# Patient Record
Sex: Female | Born: 1960 | Race: White | Hispanic: No | Marital: Married | Smoking: Never smoker
Health system: Southern US, Community
[De-identification: ages and names within clinical notes are randomized; demographics above are authoritative.]

---

## 1999-02-24 ENCOUNTER — Other Ambulatory Visit: Admission: RE | Admit: 1999-02-24 | Discharge: 1999-02-24 | Payer: Self-pay | Admitting: Obstetrics & Gynecology

## 1999-09-20 ENCOUNTER — Encounter (INDEPENDENT_AMBULATORY_CARE_PROVIDER_SITE_OTHER): Payer: Self-pay

## 1999-09-20 ENCOUNTER — Inpatient Hospital Stay (HOSPITAL_COMMUNITY): Admission: AD | Admit: 1999-09-20 | Discharge: 1999-09-23 | Payer: Self-pay | Admitting: Obstetrics & Gynecology

## 1999-10-26 ENCOUNTER — Other Ambulatory Visit: Admission: RE | Admit: 1999-10-26 | Discharge: 1999-10-26 | Payer: Self-pay | Admitting: Obstetrics and Gynecology

## 2000-10-29 ENCOUNTER — Other Ambulatory Visit: Admission: RE | Admit: 2000-10-29 | Discharge: 2000-10-29 | Payer: Self-pay | Admitting: Obstetrics & Gynecology

## 2000-11-15 ENCOUNTER — Ambulatory Visit (HOSPITAL_COMMUNITY): Admission: RE | Admit: 2000-11-15 | Discharge: 2000-11-15 | Payer: Self-pay | Admitting: Obstetrics & Gynecology

## 2000-11-15 ENCOUNTER — Encounter: Payer: Self-pay | Admitting: Obstetrics & Gynecology

## 2002-01-01 ENCOUNTER — Other Ambulatory Visit: Admission: RE | Admit: 2002-01-01 | Discharge: 2002-01-01 | Payer: Self-pay | Admitting: Obstetrics & Gynecology

## 2002-05-12 ENCOUNTER — Encounter: Payer: Self-pay | Admitting: Obstetrics & Gynecology

## 2002-05-12 ENCOUNTER — Ambulatory Visit (HOSPITAL_COMMUNITY): Admission: RE | Admit: 2002-05-12 | Discharge: 2002-05-12 | Payer: Self-pay | Admitting: Obstetrics & Gynecology

## 2003-01-14 ENCOUNTER — Other Ambulatory Visit: Admission: RE | Admit: 2003-01-14 | Discharge: 2003-01-14 | Payer: Self-pay | Admitting: Obstetrics & Gynecology

## 2005-07-25 ENCOUNTER — Ambulatory Visit (HOSPITAL_COMMUNITY): Admission: RE | Admit: 2005-07-25 | Discharge: 2005-07-25 | Payer: Self-pay | Admitting: Obstetrics and Gynecology

## 2007-03-06 ENCOUNTER — Ambulatory Visit (HOSPITAL_COMMUNITY): Admission: RE | Admit: 2007-03-06 | Discharge: 2007-03-06 | Payer: Self-pay | Admitting: Obstetrics & Gynecology

## 2011-02-10 NOTE — Op Note (Signed)
[facility]  Patient:    Sarah Stark                      MRN: [id number] Proc. Date: 09/20/99 Adm. Date:  [id number] Attending:  Lenoard Aden CC:         [facility]                           Operative Report  PREOPERATIVE DIAGNOSES:       Thirty-nine-week intrauterine pregnancy, previous  cesarean section and desire for vaginal birth after cesarean section and desire for elective sterilization, persistent face presentation -- mentum posterior presentation.  POSTOPERATIVE DIAGNOSES:      Thirty-nine-week intrauterine pregnancy, previous  cesarean section and desire for vaginal birth after cesarean section and desire for elective sterilization, persistent face presentation -- mentum posterior presentation.  PROCEDURE:                    Repeat C-section and tubal ligation.  SURGEON:                      Lenoard Aden, M.D.  ANESTHESIA:                   Spinal by Belva Agee, M.D.  ESTIMATED BLOOD LOSS:         1000 cc.  COMPLICATIONS:                None.  FINDINGS:                     Full-term living female, mentum posterior presentation, Apgars 8/9.  Placenta manually intact, three-vessel cord.  COMPLICATIONS:                None.  DISPOSITION:                  Patient to recovery room in good condition.  COUNTS:                       All counts correct.  INDICATIONS:                  After being apprised of the risks of anesthesia, infection, bleeding, injury to abdominal organs with need for repair, risks of tubal ligation -- a failure risk of 5 to 10 per thousand -- patient desires to undergo elective repeat.  Mentum posterior presentation explained to the patient; a poor prognosis with regards to vaginal delivery and spontaneous conversion to a  normal occiput presentation is noted, possible prolonged delivery with laryngeal and tracheal edema discussed with the patient and she will proceed  with elective repeat C-section.  After being apprised of these risks, consents are signed.  DESCRIPTION OF PROCEDURE:     Patient is brought to the operating room where she is administered a spinal anesthetic without complications and prepped and draped in the usual sterile fashion.  A Pfannenstiel skin incision is made after achieving adequate anesthesia and Foley catheter having been placed.  At this time, fascia is nicked in the midline and opened transversely using Mayo scissors, rectus muscle is dissected sharply in the midline, peritoneum entered sharply and atraumatically, bladder blade placed, visceroperitoneum scored in a smile-like fashion, uterus scored in a smile-like fashion, delivery of a full-term living female, Apgars 8/9, from a mentum posterior position and is  handed to pediatricians in attendance, receiving Apgars of 8/9.  Placenta delivered manually intact; three-vessel cord  noted.  Uterus exteriorized; normal tubes and ovaries noted.  Right tube grasped in an vascular portion and the mesosalpinx is cauterized to create a window in the  ampullary-isthmic portion of the tube.  At this point, the tube is tied with a  plain suture, proximally and distally, and the tubal segment is removed; good hemostasis achieved.  The same procedure is done on the left side after doing the right side.  Good hemostasis is achieved.  Ovaries appear normal.  Uterus replaced. Good hemostasis is noted after closing the uterus using a 0 Monocryl suture in  continuous-running fashion.  Bladder flap having been inspected, good hemostasis noted, rectus muscles are reapproximated in the midline; good hemostasis noted.  Paracolic gutters irrigated with all blood clots having subsequently been removed. Fascia closed using a 0 Vicryl in continuous-running fashion and skin closed using staples.  Patient tolerates procedure well and transferred to recovery in good condition. DD:   09/20/99 TD:  09/22/99 Job: [id number]

## 2011-02-10 NOTE — H&P (Signed)
[facility]  Patient:    Sarah Stark                      MRN: [id number] Adm. Date:  [id number] Attending:  Lenoard Aden                         History and Physical  CHIEF COMPLAINT:  Active labor.  HISTORY OF PRESENT ILLNESS:  A 50 year old white female G2, P1, EDD January 1, 001 who presents in active labor.  PAST MEDICAL HISTORY:  Remarkable for cesarean section and failure to progress n 1996.  SOCIAL HISTORY:  Negative.  FAMILY HISTORY:  Remarkable for heart disease, thyroid cancer and stroke.  ALLERGIES:  No known drug allergies.  PREGNANCY HISTORY:  Blood type A positive. Rh antibody negative.  Rubella immune. Hepatitis B surface antigen negative. HIV nonreactive.  PHYSICAL EXAMINATION:  GENERAL:  The patient is a well-developed white female in a moderate amount of distress.  HEENT:  Normal.  LUNGS:  Clear.  HEART:  Regular rate and rhythm.  ABDOMEN:  Soft gravid, nontender.  Estimated fetal weight of about 7-1/2 pounds. Cervix 4 cm 100% bulging bag, vertex noted.  ____________ membranes clear.  EXTREMITIES:  No cords.  NEUROLOGIC EXAM:  Nonfocal.  IMPRESSION: 1. A 39-week intrauterine pregnancy in active labor. 2. Previous cesarean section for VBAC.  PLAN:  Admit.  IV fluids, monitor labor closely.  Epidural p.r.n.   Patient to attempt a vaginal delivery.  Risks and benefits of vaginal delivery versus repeat cesarean section were discussed with the patient and husband today. DD:  09/20/99 TD:  09/22/99 Job: [id number]

## 2011-08-01 ENCOUNTER — Other Ambulatory Visit (HOSPITAL_COMMUNITY): Payer: Self-pay | Admitting: Obstetrics & Gynecology

## 2011-08-01 DIAGNOSIS — Z780 Asymptomatic menopausal state: Secondary | ICD-10-CM

## 2011-08-01 DIAGNOSIS — Z1231 Encounter for screening mammogram for malignant neoplasm of breast: Secondary | ICD-10-CM

## 2011-08-30 ENCOUNTER — Ambulatory Visit (HOSPITAL_COMMUNITY)
Admission: RE | Admit: 2011-08-30 | Discharge: 2011-08-30 | Disposition: A | Discharge status: Home / Self Care | Payer: BC Managed Care – PPO | Source: Ambulatory Visit | Attending: Obstetrics & Gynecology | Admitting: Obstetrics & Gynecology

## 2011-08-30 DIAGNOSIS — Z1231 Encounter for screening mammogram for malignant neoplasm of breast: Secondary | ICD-10-CM | POA: Insufficient documentation

## 2011-08-30 DIAGNOSIS — Z780 Asymptomatic menopausal state: Secondary | ICD-10-CM

## 2011-10-25 ENCOUNTER — Encounter: Payer: Self-pay | Admitting: *Deleted

## 2011-10-25 ENCOUNTER — Encounter: Payer: BC Managed Care – PPO | Attending: Endocrinology | Admitting: *Deleted

## 2011-10-25 DIAGNOSIS — Z713 Dietary counseling and surveillance: Secondary | ICD-10-CM | POA: Insufficient documentation

## 2011-10-25 DIAGNOSIS — E119 Type 2 diabetes mellitus without complications: Secondary | ICD-10-CM | POA: Insufficient documentation

## 2011-10-25 NOTE — Patient Instructions (Signed)
Patient will attend Core Diabetes Courses II and III as scheduled or follow up prn.  

## 2011-10-25 NOTE — Progress Notes (Signed)
  Patient was seen on 10/25/2011 for the first of a series of three diabetes self-management courses at the [facility]. The following learning objectives were met by the patient during this course:   Defines the role of glucose and insulin  Identifies type of diabetes and pathophysiology  Defines the diagnostic criteria for diabetes and prediabetes  States the risk factors for Type 2 Diabetes  States the symptoms of Type 2 Diabetes  Defines Type 2 Diabetes treatment goals  Defines Type 2 Diabetes treatment options  States the rationale for glucose monitoring  Identifies A1C, glucose targets, and testing times  Identifies proper sharps disposal  Defines the purpose of a diabetes food plan  Identifies carbohydrate food groups  Defines effects of carbohydrate foods on glucose levels  Identifies carbohydrate choices/grams/food labels  States benefits of physical activity and effect on glucose  Review of suggested activity guidelines  Last A1c: Unknown  Handouts given during class include:  Type 2 Diabetes: Basics Book  My Food Plan Book  Food and Activity Log  Patient has established the following initial goals:  Follow a diabetes meal plan.  Lose weight.  Follow-Up Plan: Patient will attend Core Diabetes Courses II and III as scheduled or follow up prn.

## 2011-12-07 ENCOUNTER — Encounter: Payer: BC Managed Care – PPO | Attending: Endocrinology | Admitting: *Deleted

## 2011-12-07 DIAGNOSIS — Z713 Dietary counseling and surveillance: Secondary | ICD-10-CM | POA: Insufficient documentation

## 2011-12-07 DIAGNOSIS — E119 Type 2 diabetes mellitus without complications: Secondary | ICD-10-CM | POA: Insufficient documentation

## 2011-12-08 ENCOUNTER — Encounter: Payer: Self-pay | Admitting: *Deleted

## 2011-12-08 NOTE — Progress Notes (Signed)
  Patient was seen on 12/07/2011 for the second of a series of three diabetes self-management courses at the [facility]. The following learning objectives were met by the patient during this course:   Explain basic nutrition maintenance and quality assurance  Describe causes, symptoms and treatment of hypoglycemia and hyperglycemia  Explain how to manage diabetes during illness  Describe the importance of good nutrition for health and healthy eating strategies  List strategies to follow meal plan when dining out  Describe the effects of alcohol on glucose and how to use it safely  Describe problem solving skills for day-to-day glucose challenges  Describe strategies to use when treatment plan needs to change  Identify important factors involved in successful weight loss  Describe ways to remain physically active  Describe the impact of regular activity on insulin resistance  Patient updated their initials goals from Core Class I to include:  Control snacks in the evening  Try increasing exercise to 4 days vs 3 days a week    Handouts given in class:  Refrigerator magnet for Sick Day Guidelines  [facility] Oral medication/insulin handout  Follow-Up Plan: Patient will attend the final class of the ADA Diabetes Self-Care Education.

## 2012-01-11 ENCOUNTER — Encounter: Payer: BC Managed Care – PPO | Attending: Endocrinology | Admitting: *Deleted

## 2012-01-11 ENCOUNTER — Encounter: Payer: Self-pay | Admitting: *Deleted

## 2012-01-11 DIAGNOSIS — E119 Type 2 diabetes mellitus without complications: Secondary | ICD-10-CM | POA: Insufficient documentation

## 2012-01-11 DIAGNOSIS — Z713 Dietary counseling and surveillance: Secondary | ICD-10-CM | POA: Insufficient documentation

## 2012-01-11 NOTE — Progress Notes (Signed)
Patient was seen on 01/11/2012 for the third of a series of three diabetes self-management courses at the [facility]. The following learning objectives were met by the patient during this course:    Describe how diabetes changes over time   Identify diabetes complications and ways to prevent them   Describe strategies that can promote heart health including lowering blood pressure and cholesterol   Describe strategies to lower dietary fat and sodium in the diet   Identify physical activities that benefit cardiovascular health   Evaluate success in meeting personal goal   Describe the belief that they can live successfully with diabetes day to day   Establish 2-3 goals that they will plan to diligently work on until they return for the free 62-month follow-up visit  The following handouts were given in class:  3 Month Follow Up Visit handout  Goal setting handout  Class evaluation form  Your patient has established the following 3 month goals for diabetes self-care:  Increase my will power to limit bad choices  Monitor fat intake better  Follow-Up Plan: Patient will attend a 3 month follow-up visit for diabetes self-management education.

## 2020-11-17 ENCOUNTER — Encounter (HOSPITAL_COMMUNITY): Payer: Self-pay

## 2020-11-17 ENCOUNTER — Emergency Department (HOSPITAL_COMMUNITY)
Admission: EM | Admit: 2020-11-17 | Discharge: 2020-11-17 | Disposition: A | Discharge status: Home / Self Care | Payer: No Typology Code available for payment source | Attending: Emergency Medicine | Admitting: Emergency Medicine

## 2020-11-17 ENCOUNTER — Other Ambulatory Visit: Payer: Self-pay

## 2020-11-17 ENCOUNTER — Emergency Department (HOSPITAL_COMMUNITY): Payer: No Typology Code available for payment source

## 2020-11-17 DIAGNOSIS — Y9301 Activity, walking, marching and hiking: Secondary | ICD-10-CM | POA: Insufficient documentation

## 2020-11-17 DIAGNOSIS — S6992XA Unspecified injury of left wrist, hand and finger(s), initial encounter: Secondary | ICD-10-CM | POA: Diagnosis present

## 2020-11-17 DIAGNOSIS — R0789 Other chest pain: Secondary | ICD-10-CM | POA: Diagnosis not present

## 2020-11-17 DIAGNOSIS — W108XXA Fall (on) (from) other stairs and steps, initial encounter: Secondary | ICD-10-CM | POA: Insufficient documentation

## 2020-11-17 DIAGNOSIS — S52572A Other intraarticular fracture of lower end of left radius, initial encounter for closed fracture: Secondary | ICD-10-CM | POA: Insufficient documentation

## 2020-11-17 DIAGNOSIS — S0181XA Laceration without foreign body of other part of head, initial encounter: Secondary | ICD-10-CM | POA: Insufficient documentation

## 2020-11-17 DIAGNOSIS — E119 Type 2 diabetes mellitus without complications: Secondary | ICD-10-CM | POA: Diagnosis not present

## 2020-11-17 DIAGNOSIS — Z23 Encounter for immunization: Secondary | ICD-10-CM | POA: Diagnosis not present

## 2020-11-17 DIAGNOSIS — W19XXXA Unspecified fall, initial encounter: Secondary | ICD-10-CM

## 2020-11-17 MED ORDER — HYDROMORPHONE HCL 1 MG/ML IJ SOLN
1.0000 mg | Freq: Once | INTRAMUSCULAR | Status: AC
Start: 2020-11-17 — End: 2020-11-17
  Administered 2020-11-17: 1 mg via INTRAVENOUS
  Filled 2020-11-17: qty 1

## 2020-11-17 MED ORDER — LIDOCAINE-EPINEPHRINE (PF) 2 %-1:200000 IJ SOLN
20.0000 mL | Freq: Once | INTRAMUSCULAR | Status: AC
  Administered 2020-11-17: 20 mL
  Filled 2020-11-17: qty 20

## 2020-11-17 MED ORDER — BUPIVACAINE HCL (PF) 0.5 % IJ SOLN
20.0000 mL | Freq: Once | INTRAMUSCULAR | Status: AC
  Administered 2020-11-17: 20 mL
  Filled 2020-11-17: qty 30

## 2020-11-17 MED ORDER — OXYCODONE-ACETAMINOPHEN 5-325 MG PO TABS: 1.0000 | ORAL_TABLET | Freq: Four times a day (QID) | ORAL | 0 refills | Status: DC | PRN

## 2020-11-17 MED ORDER — OXYCODONE-ACETAMINOPHEN 5-325 MG PO TABS: 1.0000 | ORAL_TABLET | Freq: Four times a day (QID) | ORAL | 0 refills | Status: AC | PRN

## 2020-11-17 MED ORDER — TETANUS-DIPHTH-ACELL PERTUSSIS 5-2.5-18.5 LF-MCG/0.5 IM SUSY
0.5000 mL | PREFILLED_SYRINGE | Freq: Once | INTRAMUSCULAR | Status: AC
  Administered 2020-11-17: 0.5 mL via INTRAMUSCULAR
  Filled 2020-11-17: qty 0.5

## 2020-11-17 MED ORDER — LIDOCAINE HCL (PF) 1 % IJ SOLN
30.0000 mL | Freq: Once | INTRAMUSCULAR | Status: AC
  Administered 2020-11-17: 30 mL
  Filled 2020-11-17: qty 30

## 2020-11-17 NOTE — ED Triage Notes (Signed)
Pt reports she was walking down her back porch stairs and they collapsed beneath over her. Pt fell on her left side and hit her head. Pt denies LOC and blood thinners. Pt now endorses left arm, head, and side pain. Bleeding controlled at this time.

## 2020-11-17 NOTE — Progress Notes (Signed)
Orthopedic Tech Progress Note Patient Details:  BIANCE MONCRIEF 1960-10-13 [id number]  Ortho Devices Type of Ortho Device: Ace wrap,Sugartong splint Ortho Device/Splint Location: splint and sling LUE Ortho Device/Splint Interventions: Ordered,Application   Post Interventions Patient Tolerated: Well Instructions Provided: Care of device   Jennye Moccasin 11/17/2020, 8:22 PM

## 2020-11-17 NOTE — ED Provider Notes (Signed)
[facility]-EMERGENCY DEPT Provider Note   CSN: [id number] Arrival date & time: 11/17/20  1742     History Chief Complaint  Patient presents with  . Fall    Sarah Stark is a 60 y.o. female with a history of diabetes Patient presents after suffering a fall.  Fall occurred just prior to her arrival in the emergency department.  Patient reports she landed on her left side.  Patient complains of pain to her left wrist and right ribs.  Patient rates her pain as an 8/10 on the pain scale.  Pain is constant, worse with movement.  Patient denies any loss of consciousness, neck pain, back pain, numbness or tingling to extremities, weakness in extremities, bowel or bladder dysfunction, saddle anesthesia.  Patient is not on any blood thinners.  Patient reports that she was walking down a wooden staircase is a total of 6 steps.  She was about halfway down when the staircase collapsed.  Patient reports that she fell approximately 2 feet.    Patient is right-hand dominant.  Patient sure when her last tetanus shot was.    HPI     Past Medical History:  Diagnosis Date  . Diabetes mellitus     There are no problems to display for this patient.   History reviewed. No pertinent surgical history.   OB History   No obstetric history on file.     Family History  Problem Relation Age of Onset  . Cancer Other   . Heart attack Other     Social History   Tobacco Use  . Smoking status: Never Smoker  Substance Use Topics  . Alcohol use: Yes    Home Medications Prior to Admission medications   Medication Sig Start Date End Date Taking? Authorizing Provider  Multiple Vitamins-Iron (MULTIVITAMIN/IRON PO) Take 1 tablet by mouth daily.    [provider]    Allergies    Patient has no known allergies.  Review of Systems   Review of Systems  Constitutional: Negative for chills and fever.  HENT: Negative for facial swelling.   Eyes: Negative for  visual disturbance.  Respiratory: Negative for shortness of breath.   Cardiovascular: Positive for chest pain (left chest wall).  Gastrointestinal: Negative for nausea and vomiting.  Genitourinary: Negative for difficulty urinating.  Musculoskeletal: Positive for joint swelling. Negative for back pain and neck pain.  Skin: Positive for wound.  Neurological: Negative for dizziness, tremors, seizures, syncope, facial asymmetry, speech difficulty, weakness, light-headedness, numbness and headaches.  Psychiatric/Behavioral: Negative for confusion.    Physical Exam Updated Vital Signs BP (!) 143/96 (BP Location: Right Arm)   Pulse (!) 127   Temp 98.3 F (36.8 C) (Oral)   Resp 18   SpO2 100%   Physical Exam Vitals and nursing note reviewed.  Constitutional:      General: She is not in acute distress.    Appearance: She is not ill-appearing, toxic-appearing or diaphoretic.  HENT:     Head: Normocephalic. Laceration present. No raccoon eyes, Battle's sign, abrasion, contusion, right periorbital erythema or left periorbital erythema.     Jaw: No trismus, tenderness or pain on movement.     Comments: 1.5cm Triangular laceration to left temple, all bleeding controlled Eyes:     General: Vision grossly intact. No scleral icterus.       Right eye: No discharge.        Left eye: No discharge.     Extraocular Movements: Extraocular movements intact.  Conjunctiva/sclera:     Right eye: Right conjunctiva is not injected. No chemosis, exudate or hemorrhage.    Left eye: Left conjunctiva is not injected. No chemosis, exudate or hemorrhage.    Pupils: Pupils are equal, round, and reactive to light.  Cardiovascular:     Rate and Rhythm: Normal rate.  Pulmonary:     Effort: Pulmonary effort is normal. No tachypnea, bradypnea or respiratory distress.     Breath sounds: Normal breath sounds.  Chest:     Chest wall: Tenderness (left chest wall) present. No lacerations, deformity or swelling.      Comments: No bruising noted to the chest wall Abdominal:     General: There is no distension.     Palpations: Abdomen is soft. There is no mass or pulsatile mass.     Tenderness: There is no abdominal tenderness. There is no guarding or rebound.     Comments: No bruising  Musculoskeletal:     Right shoulder: No swelling, deformity, effusion, laceration, tenderness, bony tenderness or crepitus. Normal range of motion.     Left shoulder: No swelling, deformity, effusion, laceration, tenderness, bony tenderness or crepitus. Normal range of motion.     Right upper arm: Normal.     Left upper arm: Normal.     Right elbow: Normal.     Left elbow: Normal.     Right forearm: Normal.     Left forearm: Tenderness and bony tenderness (distal radius and ulna) present. No swelling, edema, deformity or lacerations.     Right wrist: No swelling, deformity, effusion, lacerations, tenderness, bony tenderness or snuff box tenderness. Normal range of motion. Normal pulse.     Left wrist: Swelling, deformity, tenderness and bony tenderness present. No snuff box tenderness. Decreased range of motion. Normal pulse.     Right hand: No swelling, deformity, lacerations, tenderness or bony tenderness. Normal range of motion. Normal strength. Normal sensation. Normal capillary refill.     Left hand: No swelling, deformity, lacerations, tenderness or bony tenderness. Normal range of motion. Normal sensation. Normal capillary refill.     Cervical back: Normal range of motion and neck supple. No swelling, edema, deformity, erythema, signs of trauma, lacerations, rigidity, spasms, torticollis, tenderness, bony tenderness or crepitus. No pain with movement, spinous process tenderness or muscular tenderness. Normal range of motion.     Thoracic back: No swelling, edema, deformity, signs of trauma, lacerations, spasms, tenderness or bony tenderness.     Lumbar back: No swelling, edema, deformity, signs of trauma, lacerations,  spasms, tenderness or bony tenderness.  Skin:    General: Skin is warm and dry.     Coloration: Skin is not jaundiced or pale.     Findings: No bruising or erythema.  Neurological:     General: No focal deficit present.     Mental Status: She is alert.     GCS: GCS eye subscore is 4. GCS verbal subscore is 5. GCS motor subscore is 6.     Cranial Nerves: No cranial nerve deficit or facial asymmetry.     Sensory: Sensation is intact.     Motor: No weakness, tremor, seizure activity or pronator drift.     Gait: Gait is intact. Gait normal.     Comments: CN II-XII intact, +5 strength to upper right extremity and lower extremities  Unable to fully assess strength to left upper extremity due to patient's left wrist pain  Psychiatric:        Behavior: Behavior is cooperative.  ED Results / Procedures / Treatments   Labs (all labs ordered are listed, but only abnormal results are displayed) Labs Reviewed - No data to display  EKG None  Radiology DG Ribs Unilateral W/Chest Left  Result Date: 11/17/2020 CLINICAL DATA:  Pain.  Left lower anterior rib pain after a fall. EXAM: LEFT RIBS AND CHEST - 3+ VIEW COMPARISON:  None. FINDINGS: Normal heart size. No pleural effusion or edema. No airspace densities identified. No displaced rib fractures identified. IMPRESSION: Negative. Electronically Signed   By: Signa Kellaylor  Stroud M.D.   On: 11/17/2020 19:16   DG Forearm Left  Result Date: 11/17/2020 CLINICAL DATA:  Post reduction EXAM: LEFT FOREARM - 2 VIEW COMPARISON:  11/17/2020 FINDINGS: Acute comminuted intra-articular distal radius fracture with mild dorsal angulation of distal fracture fragment and about 1/4 shaft diameter dorsal displacement, no significant change in alignment. Acute displaced ulnar styloid process fracture. IMPRESSION: Acute comminuted intra-articular distal radius fracture with dorsal displacement and angulation. Acute displaced ulnar styloid process fracture  Electronically Signed   By: Jasmine PangKim  Fujinaga M.D.   On: 11/17/2020 20:35   DG Wrist Complete Left  Result Date: 11/17/2020 CLINICAL DATA:  60 year old female with left wrist pain. EXAM: LEFT WRIST - COMPLETE 3+ VIEW COMPARISON:  None FINDINGS: There is a comminuted fracture of the distal radius with dorsal angulation of the distal fracture fragment. There is intra-articular extension of the fracture. Mildly displaced fracture of the ulnar styloid. No dislocation. The bones are osteopenic. The soft tissue swelling of the wrist. IMPRESSION: Comminuted intra-articular fracture of the distal radius with dorsal angulation of the distal fracture fragment. Electronically Signed   By: Elgie CollardArash  Radparvar M.D.   On: 11/17/2020 19:15    Procedures Reduction of fracture  Date/Time: 11/17/2020 8:20 PM Performed by: Haskel SchroederBadalamente, Peter R, PA-C Authorized by: Haskel SchroederBadalamente, Peter R, PA-C  Consent: Verbal consent obtained. Written consent obtained. Risks and benefits: risks, benefits and alternatives were discussed Consent given by: patient Patient understanding: patient states understanding of the procedure being performed Patient consent: the patient's understanding of the procedure matches consent given Procedure consent: procedure consent matches procedure scheduled Imaging studies: imaging studies available Patient identity confirmed: verbally with patient and arm band Local anesthesia used: yes Anesthesia: hematoma block  Anesthesia: Local anesthesia used: yes Local Anesthetic: bupivacaine 0.5% without epinephrine and lidocaine 1% without epinephrine Anesthetic total: 10 mL  Sedation: Patient sedated: no  Patient tolerance: patient tolerated the procedure well with no immediate complications  .Marland Kitchen.Laceration Repair  Date/Time: 11/17/2020 9:56 PM Performed by: Haskel SchroederBadalamente, Peter R, PA-C Authorized by: Haskel SchroederBadalamente, Peter R, PA-C   Consent:    Consent obtained:  Verbal   Consent given by:  Patient    Risks, benefits, and alternatives were discussed: yes     Risks discussed:  Infection, need for additional repair, pain, poor cosmetic result and poor wound healing   Alternatives discussed:  No treatment and delayed treatment Universal protocol:    Procedure explained and questions answered to patient or proxy's satisfaction: yes     Patient identity confirmed:  Verbally with patient and arm band Anesthesia:    Anesthesia method:  Local infiltration   Local anesthetic:  Lidocaine 2% WITH epi Laceration details:    Location:  Face   Face location:  Forehead   Length (cm):  1   Depth (mm):  7 Pre-procedure details:    Preparation:  Patient was prepped and draped in usual sterile fashion Exploration:    Hemostasis achieved with:  Direct  pressure   Wound exploration: entire depth of wound visualized     Wound extent: no foreign bodies/material noted and no underlying fracture noted     Contaminated: no   Treatment:    Area cleansed with:  Povidone-iodine   Amount of cleaning:  Standard Skin repair:    Repair method:  Sutures   Suture size:  5-0   Suture material:  Prolene   Suture technique:  Simple interrupted   Number of sutures:  4 Approximation:    Approximation:  Close Repair type:    Repair type:  Intermediate Post-procedure details:    Dressing:  Sterile dressing   Procedure completion:  Tolerated well, no immediate complications     Medications Ordered in ED Medications  HYDROmorphone (DILAUDID) injection 1 mg (1 mg Intravenous Given 11/17/20 1903)  lidocaine-EPINEPHrine (XYLOCAINE W/EPI) 2 %-1:200000 (PF) injection 20 mL (20 mLs Infiltration Given 11/17/20 1940)  Tdap (BOOSTRIX) injection 0.5 mL (0.5 mLs Intramuscular Given 11/17/20 1940)  bupivacaine (MARCAINE) 0.5 % injection 20 mL (20 mLs Infiltration Given 11/17/20 1940)  lidocaine (PF) (XYLOCAINE) 1 % injection 30 mL (30 mLs Infiltration Given 11/17/20 1940)    ED Course  I have reviewed the triage vital signs  and the nursing notes.  Pertinent labs & imaging results that were available during my care of the patient were reviewed by me and considered in my medical decision making (see chart for details).    MDM Rules/Calculators/A&P                          Alert female no acute distress, nontoxic-appearing.  Patient resents with chief complaint of left wrist and left chest pain after suffering a fall.  Denies any loss of consciousness, neck pain or back pain.  Patient denies any alcohol or drug use today.    Patient has obvious deformity to left wrist.  Pulse, motor, sensation intact to left wrist and left hand.  Tenderness to left chest wall, no bruising or deformity noted.  No spinous process tenderness to cervical, thoracic, or lumbar spine.  No step-off or deformity noted to cervical, thoracic, or lumbar spine.  Patient has full range of motion of neck without any pain or difficulty.  Patient noted to have 1 cm laceration to her left temple all bleeding controlled.  No focal neurological deficit noted.  Patient meets criteria for C-spine and head clearance via [place] C-spine and CT head injury rules.    Patient was given Dilaudid for pain management.  X-ray of left wrist and left chest were obtained.    Chest x-ray showed no displaced rib fractures.  X-ray left wrist showed commuted intra-articular fracture of the distal radius with dorsal angulation of the distal.  Consult was placed to hand specialist. 19:20 Spoke to Dr. Aundria Rud who recommended reduction and sugar tong splint.  Patient is to follow up with Dr. Melvyn Novas.   Verbal and written consent were obtained from patient for hematoma block and reduction.  Procedure was tolerated without any immediate complications.  Pulse, motor, sensation were intact after reduction.  Post reduction film showed minimal improvement in position.  Laceration repair was performed.  Entire depth of wound was observed in a clear bloodless field.  No foreign  bodies noted.  Patient tolerated procedure well without any immediate complications.  Patient advised to have sutures removed in 5 to 7 days.   Patient was given prescription for Percocet to help manage her pain in outpatient  setting.  She was advised to drive or operate heavy machinery taking this medication.  Patient advised not to combine this medication with alcohol, or other CNS depressants.  Discussed results, findings, treatment and follow up. Patient advised of return precautions. Patient verbalized understanding and agreed with plan.  Patient was discussed with and evaluated by Dr. Fredderick Phenix.   Final Clinical Impression(s) / ED Diagnoses Final diagnoses:  Fall, initial encounter  Other closed intra-articular fracture of distal end of left radius, initial encounter  Laceration of forehead, initial encounter    Rx / DC Orders ED Discharge Orders         Ordered    oxyCODONE-acetaminophen (PERCOCET/ROXICET) 5-325 MG tablet  Every 6 hours PRN,   Status:  Discontinued        11/17/20 2105    oxyCODONE-acetaminophen (PERCOCET/ROXICET) 5-325 MG tablet  Every 6 hours PRN        11/17/20 2106           Berneice Heinrich 11/17/20 2212    Rolan Bucco, MD 11/17/20 2232

## 2020-11-17 NOTE — Discharge Instructions (Addendum)
You came to the emergency department to be evaluated for your injuries after suffering a fall.  Your chest x-ray showed no fractures.  Your wrist fracture showed only distal radius fracture.  This fracture was reduced, splinted and placed in a sling.  You will need to follow-up with the orthopedic hand specialist.  Please call their office tomorrow morning to set up an appointment.  You also had a laceration to your forehead that required stitches.  Please go to an urgent care or return to the emergency department in 5 to 7 days to have the stitches removed.  These keep the area dry for the next 24 hours.  After that you may gently clean the area with soap and water.  Please do not submerge the area underwater.  Please seek medical attention if you have a red streak going away from your wound or you develop severe swelling around wound.  Get help right away if: You have severe pain. You have a severe increase in swelling. Your fingers become very cold or blue. You have numbness, tingling, or loss of feeling in your hands or fingers.  Today you received medications that may make you sleepy or impair your ability to make decisions.  For the next 24 hours please do not drive, operate heavy machinery, care for a small child with out another adult present, or perform any activities that may cause harm to you or someone else if you were to fall asleep or be impaired.   You are being prescribed a medication which may make you sleepy. Please follow up of listed precautions for at least 24 hours after taking one dose.

## 2022-08-21 IMAGING — CR DG FOREARM 2V*L*
2 series · 2 of 2 positions shown · non-contrast
Comparison: 11/17/2020

CLINICAL DATA: Post reduction

EXAM:
LEFT FOREARM - 2 VIEW

[x forearm ap left]
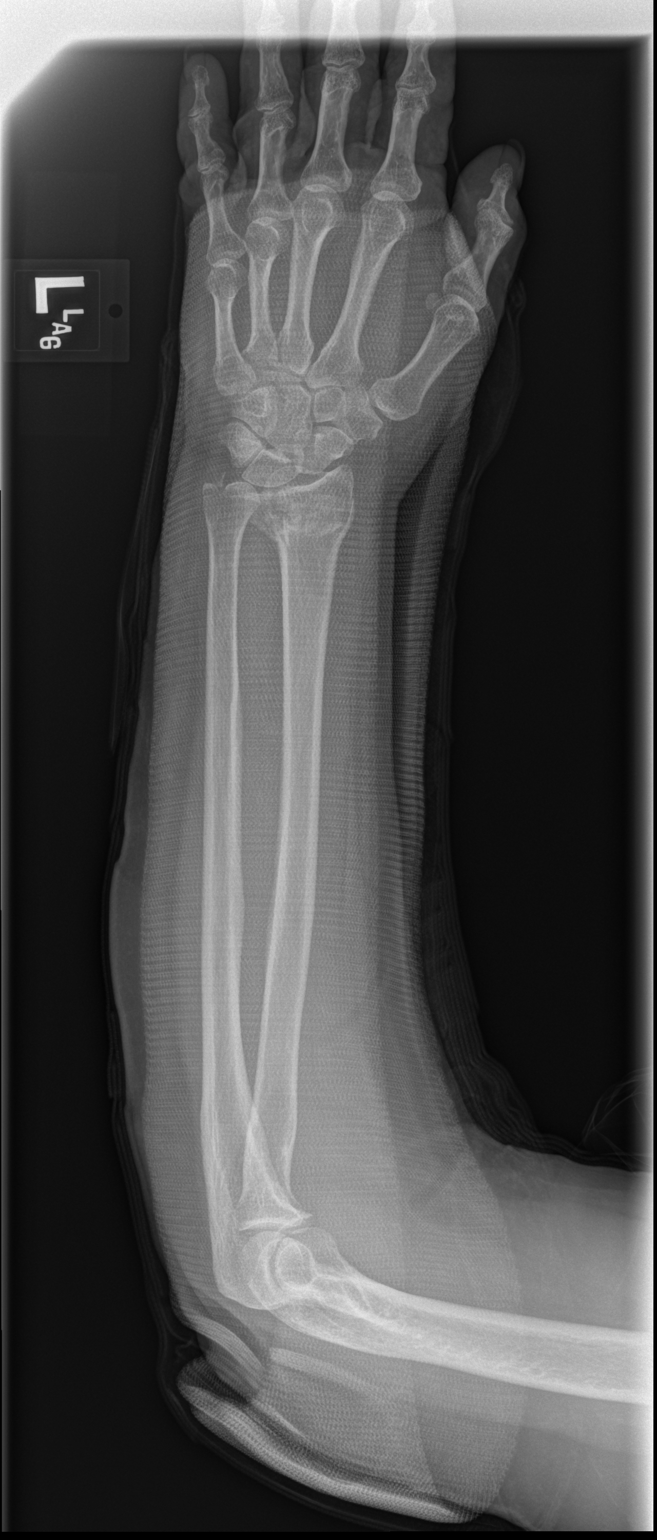

[x forearm lat left]
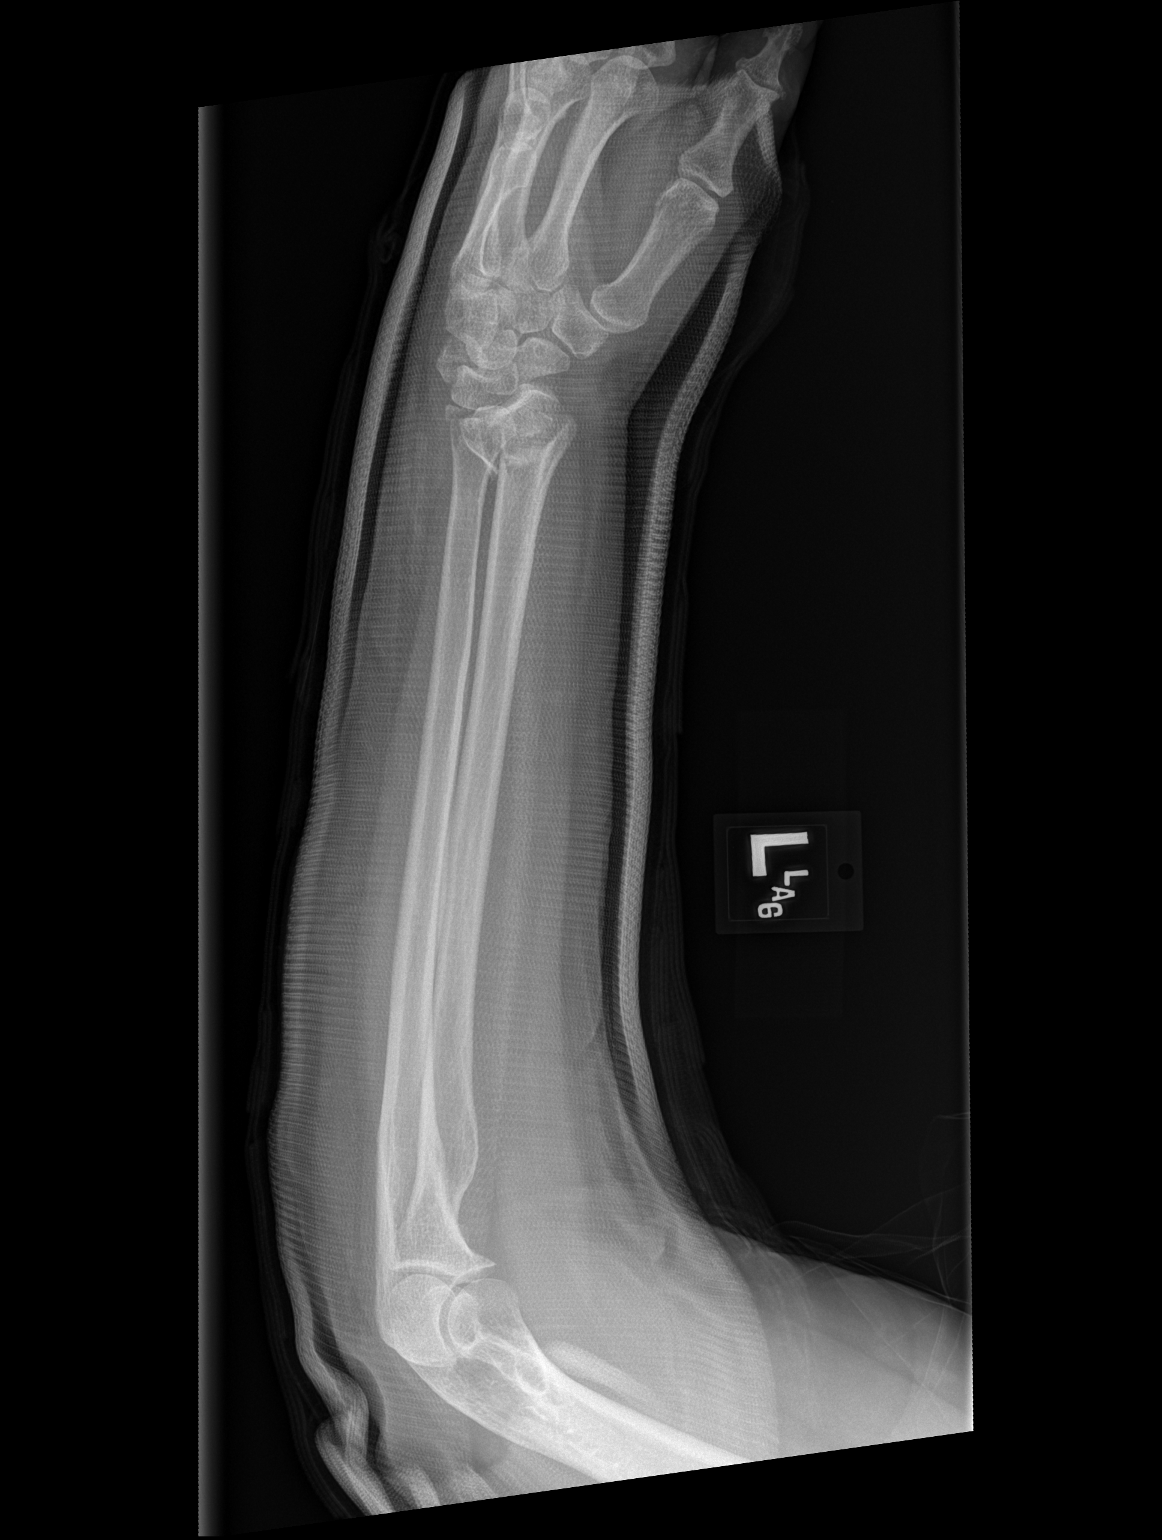

[2 of 2 positions shown; findings below may reference images not displayed]

FINDINGS: Acute comminuted intra-articular distal radius fracture with mild
dorsal angulation of distal fracture fragment and about [DATE] shaft
diameter dorsal displacement, no significant change in alignment.
Acute displaced ulnar styloid process fracture.
IMPRESSION: Acute comminuted intra-articular distal radius fracture with dorsal
displacement and angulation. Acute displaced ulnar styloid process
fracture

## 2023-11-16 ENCOUNTER — Other Ambulatory Visit (HOSPITAL_COMMUNITY): Payer: Self-pay | Admitting: Endocrinology

## 2023-11-16 DIAGNOSIS — E7800 Pure hypercholesterolemia, unspecified: Secondary | ICD-10-CM
